# Patient Record
Sex: Male | Born: 1995 | Race: White | Hispanic: No | Marital: Single | Smoking: Never smoker
Health system: Southern US, Community
[De-identification: ages and names within clinical notes are randomized; demographics above are authoritative.]

## PROBLEM LIST (undated history)

## (undated) DIAGNOSIS — Z98811 Dental restoration status: Secondary | ICD-10-CM

## (undated) DIAGNOSIS — S62009K Unspecified fracture of navicular [scaphoid] bone of unspecified wrist, subsequent encounter for fracture with nonunion: Secondary | ICD-10-CM

## (undated) HISTORY — PX: WISDOM TOOTH EXTRACTION: SHX21

## (undated) HISTORY — PX: TYMPANOSTOMY TUBE PLACEMENT: SHX32

---

## 2015-08-19 ENCOUNTER — Encounter (HOSPITAL_BASED_OUTPATIENT_CLINIC_OR_DEPARTMENT_OTHER): Payer: Self-pay | Admitting: *Deleted

## 2015-08-19 ENCOUNTER — Other Ambulatory Visit: Payer: Self-pay | Admitting: Orthopedic Surgery

## 2015-08-20 NOTE — H&P (Signed)
Bryan Romero is an 20 y.o. male.   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  He has 2 seasons remaining.  He reports that he injured his right wrist in a football game, and was evaluated by medical staff, thought to have a wrist sprain, and has continued to play for the remainder of the season taping the wrist.  X-rays were not performed.  Due to continued pain that is becoming worse with lesser activities, he was evaluated on 08-06-15 by Dr. Chinita Romero in [place], who obtained x-rays revealing a nonunion of the right scaphoid at the junction of the middle and proximal one third, with what appeared to be no significant back deformity.  MRI scan was performed on one-27-17, revealing persisting fracture of the proximal scaphoid without significant displacement, with symmetric signal within the proximal and distal poles.  History reviewed. No pertinent past medical history.  Past Surgical History  Procedure Laterality Date  . Wisdom tooth extraction      History reviewed. No pertinent family history. Social History:  reports that he has never smoked. He has never used smokeless tobacco. He reports that he drinks alcohol. He reports that he does not use illicit drugs.  Allergies: No Known Allergies  No prescriptions prior to admission    No results found for this or any previous visit (from the past 48 hour(s)). No results found.  Review of Systems  All other systems reviewed and are negative.   Height  (1.854 m), weight 95.255 kg (210 lb). Physical Exam  Constitutional:  WD, WN, NAD HEENT:  NCAT, EOMI Neuro/Psych:  Alert & oriented to person, place, and time; appropriate mood & affect Lymphatic: No generalized UE edema or lymphadenopathy Extremities / MSK:  Both UE are normal with respect  to appearance, ranges of motion, joint stability, muscle strength/tone, sensation, & perfusion except as otherwise noted:  Right wrist has nearly full motion, tenderness over the proximal pole of the scaphoid and increased pain with grip strength testing which is diminished slightly.  Watson's maneuver negative.  Labs / Xrays:  No radiographic studies obtained today.  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  One could treat this with immobilization and application of ultrasound bone stimulation, or alternatively could proceed with open surgical stabilization and application of ultrasound bone stimulation postoperatively.  Each course has different advantages and disadvantages.  After careful thought and consider deliberation, he indicated he would like to proceed with surgical treatment.  I indicated this would likely be a dorsal approach, with either one large or too smaller screws securing the reduction.  We will plan to seek authorization for and obtained an ultrasound bone stimulator to be applied as an adjunct postoperatively.  He was placed into a forearm-based thumb spica splint with radial stay removed and we will call him once the appropriate arrangements can be completed.  Bryan Romero A., MD 08/20/2015, 1:57 PM

## 2015-08-24 ENCOUNTER — Ambulatory Visit (HOSPITAL_COMMUNITY): Payer: BLUE CROSS/BLUE SHIELD

## 2015-08-24 ENCOUNTER — Encounter (HOSPITAL_BASED_OUTPATIENT_CLINIC_OR_DEPARTMENT_OTHER)
Admission: RE | Disposition: A | Discharge status: Home / Self Care | Payer: Self-pay | Source: Ambulatory Visit | Attending: Orthopedic Surgery

## 2015-08-24 ENCOUNTER — Ambulatory Visit (HOSPITAL_BASED_OUTPATIENT_CLINIC_OR_DEPARTMENT_OTHER)
Admission: RE | Admit: 2015-08-24 | Discharge: 2015-08-24 | Disposition: A | Discharge status: Home / Self Care | Payer: BLUE CROSS/BLUE SHIELD | Source: Ambulatory Visit | Attending: Orthopedic Surgery | Admitting: Orthopedic Surgery

## 2015-08-24 ENCOUNTER — Ambulatory Visit (HOSPITAL_BASED_OUTPATIENT_CLINIC_OR_DEPARTMENT_OTHER): Payer: BLUE CROSS/BLUE SHIELD | Admitting: Certified Registered"

## 2015-08-24 ENCOUNTER — Encounter (HOSPITAL_BASED_OUTPATIENT_CLINIC_OR_DEPARTMENT_OTHER): Payer: Self-pay | Admitting: *Deleted

## 2015-08-24 DIAGNOSIS — S62031A Displaced fracture of proximal third of navicular [scaphoid] bone of right wrist, initial encounter for closed fracture: Secondary | ICD-10-CM | POA: Insufficient documentation

## 2015-08-24 DIAGNOSIS — X58XXXA Exposure to other specified factors, initial encounter: Secondary | ICD-10-CM | POA: Insufficient documentation

## 2015-08-24 DIAGNOSIS — S62301A Unspecified fracture of second metacarpal bone, left hand, initial encounter for closed fracture: Secondary | ICD-10-CM | POA: Diagnosis present

## 2015-08-24 DIAGNOSIS — Z4789 Encounter for other orthopedic aftercare: Secondary | ICD-10-CM

## 2015-08-24 HISTORY — PX: ORIF SCAPHOID FRACTURE: SHX2130

## 2015-08-24 SURGERY — OPEN REDUCTION INTERNAL FIXATION (ORIF) SCAPHOID FRACTURE
Anesthesia: General | Site: Wrist | Laterality: Right

## 2015-08-24 MED ORDER — PROPOFOL 10 MG/ML IV BOLUS
INTRAVENOUS | Status: DC | PRN
  Administered 2015-08-24: 200 mg via INTRAVENOUS

## 2015-08-24 MED ORDER — CEFAZOLIN SODIUM-DEXTROSE 2-3 GM-% IV SOLR
2.0000 g | INTRAVENOUS | Status: AC
  Administered 2015-08-24: 2 g via INTRAVENOUS

## 2015-08-24 MED ORDER — MIDAZOLAM HCL 2 MG/2ML IJ SOLN
INTRAMUSCULAR | Status: AC
  Filled 2015-08-24: qty 2

## 2015-08-24 MED ORDER — LACTATED RINGERS IV SOLN
INTRAVENOUS | Status: DC
  Administered 2015-08-24: 10:00:00 via INTRAVENOUS
  Administered 2015-08-24: 10 mL/h via INTRAVENOUS

## 2015-08-24 MED ORDER — BUPIVACAINE-EPINEPHRINE (PF) 0.5% -1:200000 IJ SOLN
INTRAMUSCULAR | Status: DC | PRN
  Administered 2015-08-24: 30 mL via PERINEURAL

## 2015-08-24 MED ORDER — FENTANYL CITRATE (PF) 100 MCG/2ML IJ SOLN
INTRAMUSCULAR | Status: AC
  Filled 2015-08-24: qty 2

## 2015-08-24 MED ORDER — GLYCOPYRROLATE 0.2 MG/ML IJ SOLN: 0.2000 mg | Freq: Once | INTRAMUSCULAR | Status: DC | PRN

## 2015-08-24 MED ORDER — SCOPOLAMINE 1 MG/3DAYS TD PT72: 1.0000 | MEDICATED_PATCH | Freq: Once | TRANSDERMAL | Status: DC | PRN

## 2015-08-24 MED ORDER — OXYCODONE-ACETAMINOPHEN 5-325 MG PO TABS: 1.0000 | ORAL_TABLET | Freq: Four times a day (QID) | ORAL | Status: DC | PRN

## 2015-08-24 MED ORDER — DEXAMETHASONE SODIUM PHOSPHATE 10 MG/ML IJ SOLN
INTRAMUSCULAR | Status: DC | PRN
  Administered 2015-08-24: 10 mg via INTRAVENOUS

## 2015-08-24 MED ORDER — FENTANYL CITRATE (PF) 100 MCG/2ML IJ SOLN
50.0000 ug | INTRAMUSCULAR | Status: DC | PRN
  Administered 2015-08-24: 100 ug via INTRAVENOUS

## 2015-08-24 MED ORDER — CEFAZOLIN SODIUM-DEXTROSE 2-3 GM-% IV SOLR
INTRAVENOUS | Status: AC
  Filled 2015-08-24: qty 50

## 2015-08-24 MED ORDER — MIDAZOLAM HCL 2 MG/2ML IJ SOLN
1.0000 mg | INTRAMUSCULAR | Status: DC | PRN
  Administered 2015-08-24 (×2): 2 mg via INTRAVENOUS

## 2015-08-24 MED ORDER — LACTATED RINGERS IV SOLN: INTRAVENOUS | Status: DC

## 2015-08-24 MED ORDER — LIDOCAINE HCL (CARDIAC) 20 MG/ML IV SOLN
INTRAVENOUS | Status: AC
  Filled 2015-08-24: qty 5

## 2015-08-24 MED ORDER — ONDANSETRON HCL 4 MG/2ML IJ SOLN
INTRAMUSCULAR | Status: AC
  Filled 2015-08-24: qty 2

## 2015-08-24 MED ORDER — ONDANSETRON HCL 4 MG/2ML IJ SOLN
INTRAMUSCULAR | Status: DC | PRN
  Administered 2015-08-24: 4 mg via INTRAVENOUS

## 2015-08-24 MED ORDER — DEXAMETHASONE SODIUM PHOSPHATE 10 MG/ML IJ SOLN
INTRAMUSCULAR | Status: AC
  Filled 2015-08-24: qty 1

## 2015-08-24 MED ORDER — LIDOCAINE HCL (CARDIAC) 20 MG/ML IV SOLN
INTRAVENOUS | Status: DC | PRN
  Administered 2015-08-24: 30 mg via INTRAVENOUS

## 2015-08-24 SURGICAL SUPPLY — 58 items
BANDAGE COBAN STERILE 2 (GAUZE/BANDAGES/DRESSINGS) IMPLANT
BIT DRILL 1.8 CANN MAX VPC (BIT) ×2 IMPLANT
BIT DRILL CANN 2.4 (BIT) ×2
BIT DRILL CANN MAX VPC 2.4 (BIT) ×1 IMPLANT
BLADE MINI RND TIP GREEN BEAV (BLADE) IMPLANT
BLADE SURG 15 STRL LF DISP TIS (BLADE) ×1 IMPLANT
BLADE SURG 15 STRL SS (BLADE) ×1
BNDG COHESIVE 4X5 TAN STRL (GAUZE/BANDAGES/DRESSINGS) ×2 IMPLANT
BNDG ESMARK 4X9 LF (GAUZE/BANDAGES/DRESSINGS) ×2 IMPLANT
BNDG GAUZE ELAST 4 BULKY (GAUZE/BANDAGES/DRESSINGS) ×4 IMPLANT
BONE MARROW ASPIRATION NEEDLE ×2 IMPLANT
BRUSH SCRUB EZ PLAIN DRY (MISCELLANEOUS) IMPLANT
CANISTER SUCT 1200ML W/VALVE (MISCELLANEOUS) ×2 IMPLANT
CHLORAPREP W/TINT 26ML (MISCELLANEOUS) ×2 IMPLANT
CORDS BIPOLAR (ELECTRODE) ×2 IMPLANT
COVER BACK TABLE 60X90IN (DRAPES) ×2 IMPLANT
COVER MAYO STAND STRL (DRAPES) ×2 IMPLANT
CUFF TOURNIQUET SINGLE 18IN (TOURNIQUET CUFF) ×2 IMPLANT
CUFF TOURNIQUET SINGLE 24IN (TOURNIQUET CUFF) IMPLANT
DRAPE C-ARM 42X72 X-RAY (DRAPES) ×2 IMPLANT
DRAPE EXTREMITY T 121X128X90 (DRAPE) ×2 IMPLANT
DRAPE SURG 17X23 STRL (DRAPES) ×2 IMPLANT
DRSG ADAPTIC 3X8 NADH LF (GAUZE/BANDAGES/DRESSINGS) ×2 IMPLANT
DRSG EMULSION OIL 3X3 NADH (GAUZE/BANDAGES/DRESSINGS) IMPLANT
ELECT REM PT RETURN 9FT ADLT (ELECTROSURGICAL) ×2
ELECTRODE REM PT RTRN 9FT ADLT (ELECTROSURGICAL) ×1 IMPLANT
GAUZE SPONGE 4X4 12PLY STRL (GAUZE/BANDAGES/DRESSINGS) ×2 IMPLANT
GLOVE BIO SURGEON STRL SZ7.5 (GLOVE) ×2 IMPLANT
GLOVE BIOGEL PI IND STRL 7.0 (GLOVE) ×1 IMPLANT
GLOVE BIOGEL PI IND STRL 8 (GLOVE) ×1 IMPLANT
GLOVE BIOGEL PI INDICATOR 7.0 (GLOVE) ×1
GLOVE BIOGEL PI INDICATOR 8 (GLOVE) ×1
GLOVE ECLIPSE 6.5 STRL STRAW (GLOVE) ×4 IMPLANT
GOWN STRL REUS W/TWL XL LVL3 (GOWN DISPOSABLE) ×2 IMPLANT
K-WIRE COCR 0.9X95 (WIRE) ×4
KWIRE COCR 0.9X95 (WIRE) ×2 IMPLANT
NEEDLE HYPO 25X1 1.5 SAFETY (NEEDLE) IMPLANT
NS IRRIG 1000ML POUR BTL (IV SOLUTION) ×2 IMPLANT
PACK BASIN DAY SURGERY FS (CUSTOM PROCEDURE TRAY) ×2 IMPLANT
PADDING CAST ABS 4INX4YD NS (CAST SUPPLIES) ×1
PADDING CAST ABS COTTON 4X4 ST (CAST SUPPLIES) ×1 IMPLANT
PENCIL BUTTON HOLSTER BLD 10FT (ELECTRODE) ×2 IMPLANT
PUTTY DBM STAGRAFT 2CC (Putty) ×2 IMPLANT
RUBBERBAND STERILE (MISCELLANEOUS) IMPLANT
SCREW CANN MAX VPC 3.4X18 (Screw) ×3 IMPLANT
SCREW VPS 3.4X18MM (Screw) ×3 IMPLANT
SLEEVE SCD COMPRESS KNEE MED (MISCELLANEOUS) ×2 IMPLANT
STOCKINETTE 6  STRL (DRAPES) ×1
STOCKINETTE 6 STRL (DRAPES) ×1 IMPLANT
SUT VIC AB 2-0 CT3 27 (SUTURE) ×2 IMPLANT
SUT VICRYL 4-0 PS2 18IN ABS (SUTURE) IMPLANT
SUT VICRYL RAPIDE 4-0 (SUTURE) IMPLANT
SUT VICRYL RAPIDE 4/0 PS 2 (SUTURE) ×2 IMPLANT
SYR BULB 3OZ (MISCELLANEOUS) ×2 IMPLANT
SYRINGE 10CC LL (SYRINGE) IMPLANT
TOWEL OR 17X24 6PK STRL BLUE (TOWEL DISPOSABLE) ×2 IMPLANT
TOWEL OR NON WOVEN STRL DISP B (DISPOSABLE) ×2 IMPLANT
UNDERPAD 30X30 (UNDERPADS AND DIAPERS) ×2 IMPLANT

## 2015-08-24 NOTE — Discharge Instructions (Addendum)
Discharge Instructions ° ° °You have a dressing with a plaster splint incorporated in it. °Move your fingers as much as possible, making a full fist and fully opening the fist. °Elevate your hand to reduce pain & swelling of the digits.  Ice over the operative site may be helpful to reduce pain & swelling.  DO NOT USE HEAT. °Pain medicine has been prescribed for you.  °Use your medicine as needed over the first 48 hours, and then you can begin to taper your use.  You may use Tylenol in place of your prescribed pain medication, but not IN ADDITION to it. °Leave the dressing in place until you return to our office.  °You may shower, but keep the bandage clean & dry.  °You may drive a car when you are off of prescription pain medications and can safely control your vehicle with both hands. ° ° °Please call [phone] during normal business hours or [phone] after hours for any problems. Including the following: ° °- excessive redness of the incisions °- drainage for more than 4 days °- fever of more than 101.5 F ° °*Please note that pain medications will not be refilled after hours or on weekends. ° ° °Post Anesthesia Home Care Instructions ° °Activity: °Get plenty of rest for the remainder of the day. A responsible adult should stay with you for 24 hours following the procedure.  °For the next 24 hours, DO NOT: °-Drive a car °-Operate machinery °-Drink alcoholic beverages °-Take any medication unless instructed by your physician °-Make any legal decisions or sign important papers. ° °Meals: °Start with liquid foods such as gelatin or soup. Progress to regular foods as tolerated. Avoid greasy, spicy, heavy foods. If nausea and/or vomiting occur, drink only clear liquids until the nausea and/or vomiting subsides. Call your physician if vomiting continues. ° °Special Instructions/Symptoms: °Your throat may feel dry or sore from the anesthesia or the breathing tube placed in your throat during surgery. If this  causes discomfort, gargle with warm salt water. The discomfort should disappear within 24 hours. ° °If you had a scopolamine patch placed behind your ear for the management of post- operative nausea and/or vomiting: ° °1. The medication in the patch is effective for 72 hours, after which it should be removed.  Wrap patch in a tissue and discard in the trash. Wash hands thoroughly with soap and water. °2. You may remove the patch earlier than 72 hours if you experience unpleasant side effects which may include dry mouth, dizziness or visual disturbances. °3. Avoid touching the patch. Wash your hands with soap and water after contact with the patch. °Regional Anesthesia Blocks ° °1. Numbness or the inability to move the "blocked" extremity may last from 3-48 hours after placement. The length of time depends on the medication injected and your individual response to the medication. If the numbness is not going away after 48 hours, call your surgeon. ° °2. The extremity that is blocked will need to be protected until the numbness is gone and the  Strength has returned. Because you cannot feel it, you will need to take extra care to avoid injury. Because it may be weak, you may have difficulty moving it or using it. You may not know what position it is in without looking at it while the block is in effect. ° °3. For blocks in the legs and feet, returning to weight bearing and walking needs to be done carefully. You will need to wait until the numbness   is entirely gone and the strength has returned. You should be able to move your leg and foot normally before you try and bear weight or walk. You will need someone to be with you when you first try to ensure you do not fall and possibly risk injury. ° °4. Bruising and tenderness at the needle site are common side effects and will resolve in a few days. ° °5. Persistent numbness or new problems with movement should be communicated to the surgeon or the [facility] ([phone])/ [facility] ([phone]). °  ° ° °

## 2015-08-24 NOTE — Progress Notes (Signed)
Assisted Dr. Crews with right, ultrasound guided, supraclavicular block. Side rails up, monitors on throughout procedure. See vital signs in flow sheet. Tolerated Procedure well. 

## 2015-08-24 NOTE — Op Note (Signed)
08/24/2015  7:43 AM  PATIENT:  Bryan Romero  20 y.o. male  PRE-OPERATIVE DIAGNOSIS:  Right scaphoid proximal pole nonunion  POST-OPERATIVE DIAGNOSIS:  Same  PROCEDURE:  Open treatment of right scaphoid proximal pole nonunion with bone graft substitute augmentation  SURGEON: Cliffton Asters. Janee Morn, MD  PHYSICIAN ASSISTANT: Danielle Rankin, OPA-C  ANESTHESIA:  regional and general  SPECIMENS:  None  DRAINS:   None  EBL:  less than 50 mL  PREOPERATIVE INDICATIONS:  Bryan Romero is a  20 y.o. male with a right scaphoid proximal pole nonunion  The risks benefits and alternatives were discussed with the patient preoperatively including but not limited to the risks of infection, bleeding, nerve injury, cardiopulmonary complications, the need for revision surgery, among others, and the patient verbalized understanding and consented to proceed.  OPERATIVE IMPLANTS: Biomet max the PC screws 2, 1 3.35mm x 18 and one 2.4 x 18  OPERATIVE PROCEDURE:  After receiving prophylactic antibiotics and a regional block, the patient was escorted to the operative theatre and placed in a supine position. Gen. anesthesia was induced. A surgical "time-out" was performed during which the planned procedure, proposed operative site, and the correct patient identity were compared to the operative consent and agreement confirmed by the circulating nurse according to current facility policy.  Following application of a tourniquet to the operative extremity, the exposed skin was prepped with Chloraprep and draped in the usual sterile fashion.  The limb was exsanguinated with an Esmarch bandage and the tourniquet inflated to approximately higher than systolic BP.  A 2 lens exam incision was made over the carpus. The third compartment was opened distally, fourth compartment tendons were retracted ulnarly, and to limited ligament sparing capsulotomy was performed and capsule reflected radially. The nonunion was  identified.  It appeared to have some hinging ulnarly, it was opened, and debrided with a curet. The bone on either side was scarred 5 with the working 70 curet and a a water on power. Biomet Sta-Graft was placed in the side of the nonunion, then it was reduced and secured provisionally with a K wire. Subsequently, a smaller 2.4 mm headless compression screw was placed in standard fashion with fluoroscopic guidance. This entry point was more dorsal and ulnar and the second screw which was placed more prominently and radially, both of them crossing the fracture site and stabilizing the proximal pole nicely, providing compression. The second screw placed was at first a 2.4 mm screw, but the head stripped, requiring its removal before it was completely inserted and I opted to upsize then to a 3 mm screw. In each one, once the path of the screws and then made with the cannulated drill bit, graft material was introduced down the path of the screw with a syringe.  Final fluoroscopic images were obtained, revealing satisfactory alignment of the fracture nonunion site with internal fixation which was not intra-articular.  The wound was irrigated, and the capsule closed with 3-0 Vicryl suture. A small split in the retinaculum was also repaired with the same and the tourniquet was released. Some additional hemostasis was obtained and the skin was closed with 4-0 Vicryl Rapide running horizontal mattress suture. A short arm splint dressing was applied with a thumb spica plaster component and he was awakened and taken to recovery in stable condition, breathing spontaneously.  DISPOSITION: He'll be discharged home today with typical instructions, returning in 10-15 days, at which time she didn't x-rays of the right wrist included a scaphoid view  out of splint and transitioned to application of a short arm thumb spica cast with a window for ultrasound bone stimulation.

## 2015-08-24 NOTE — Anesthesia Procedure Notes (Addendum)
Anesthesia Regional Block:  Infraclavicular brachial plexus block  Pre-Anesthetic Checklist: ,, timeout performed, Correct Patient, Correct Site, Correct Laterality, Correct Procedure, Correct Position, site marked, Risks and benefits discussed,  Surgical consent,  Pre-op evaluation,  At surgeon's request and post-op pain management  Laterality: Right and Upper  Prep: chloraprep       Needles:  Injection technique: Single-shot  Needle Type: Echogenic Stimulator Needle     Needle Length: 5cm 5 cm Needle Gauge: 21 and 21 G    Additional Needles:  Procedures: ultrasound guided (picture in chart) Infraclavicular brachial plexus block Narrative:  Start time: 08/24/2015 8:15 AM End time: 08/24/2015 8:20 AM Injection made incrementally with aspirations every 5 mL.  Performed by: Personally  Anesthesiologist: CREWS, DAVID   Procedure Name: LMA Insertion Date/Time: 08/24/2015 8:54 AM Performed by: Taiya Nutting D Pre-anesthesia Checklist: Patient identified, Emergency Drugs available, Suction available and Patient being monitored Patient Re-evaluated:Patient Re-evaluated prior to inductionOxygen Delivery Method: Circle System Utilized Preoxygenation: Pre-oxygenation with 100% oxygen Intubation Type: IV induction Ventilation: Mask ventilation without difficulty LMA: LMA inserted LMA Size: 4.0 Number of attempts: 1 Airway Equipment and Method: Bite block Placement Confirmation: positive ETCO2 Tube secured with: Tape Dental Injury: Teeth and Oropharynx as per pre-operative assessment       Right Infraclavicular block image

## 2015-08-24 NOTE — Interval H&P Note (Signed)
History and Physical Interval Note:  08/24/2015 7:42 AM  Bryan Romero  has presented today for surgery, with the diagnosis of RIGHT WRIST SCAPHOID NONUNION [phone]  The various methods of treatment have been discussed with the patient and family. After consideration of risks, benefits and other options for treatment, the patient has consented to  Procedure(s) with comments: OPEN TREATMENT OF RIGHT SCAPHOID NONUNION (Right) - PRE -OP BLOCK as a surgical intervention .  The patient's history has been reviewed, patient examined, no change in status, stable for surgery.  I have reviewed the patient's chart and labs.  Questions were answered to the patient's satisfaction.     Bryan Romero A.

## 2015-08-24 NOTE — Anesthesia Preprocedure Evaluation (Addendum)
Anesthesia Evaluation  Patient identified by MRN, date of birth, ID band Patient awake    Reviewed: Allergy & Precautions, NPO status , Patient's Chart, lab work & pertinent test results  Airway Mallampati: I  TM Distance: >3 FB Neck ROM: Full    Dental  (+) Teeth Intact, Dental Advisory Given   Pulmonary  breath sounds clear to auscultation        Cardiovascular Rhythm:Regular Rate:Normal     Neuro/Psych    GI/Hepatic   Endo/Other    Renal/GU      Musculoskeletal   Abdominal   Peds  Hematology   Anesthesia Other Findings   Reproductive/Obstetrics                            Anesthesia Physical Anesthesia Plan  ASA: I  Anesthesia Plan: General   Post-op Pain Management: MAC Combined w/ Regional for Post-op pain   Induction: Intravenous  Airway Management Planned: LMA  Additional Equipment:   Intra-op Plan:   Post-operative Plan: Extubation in OR  Informed Consent: I have reviewed the patients History and Physical, chart, labs and discussed the procedure including the risks, benefits and alternatives for the proposed anesthesia with the patient or authorized representative who has indicated his/her understanding and acceptance.   Dental advisory given  Plan Discussed with: CRNA, Anesthesiologist and Surgeon  Anesthesia Plan Comments:         Anesthesia Quick Evaluation  

## 2015-08-24 NOTE — Transfer of Care (Signed)
Immediate Anesthesia Transfer of Care Note  Patient: Bryan Romero  Procedure(s) Performed: Procedure(s) with comments: OPEN TREATMENT OF RIGHT SCAPHOID NONUNION (Right) - PRE -OP BLOCK  Patient Location: PACU  Anesthesia Type:GA combined with regional for post-op pain  Level of Consciousness: awake and patient cooperative  Airway & Oxygen Therapy: Patient Spontanous Breathing and Patient connected to face mask oxygen  Post-op Assessment: Report given to RN and Post -op Vital signs reviewed and stable  Post vital signs: Reviewed and stable  Last Vitals:  Filed Vitals:   08/24/15 0815 08/24/15 0820  BP: 141/87   Pulse: 75 83  Temp:    Resp: 15 18    Complications: No apparent anesthesia complications

## 2015-08-24 NOTE — Anesthesia Postprocedure Evaluation (Signed)
Anesthesia Post Note  Patient: Trayveon Beckford  Procedure(s) Performed: Procedure(s) (LRB): OPEN TREATMENT OF RIGHT SCAPHOID NONUNION (Right)  Patient location during evaluation: PACU Anesthesia Type: General Level of consciousness: awake and alert Pain management: pain level controlled Vital Signs Assessment: post-procedure vital signs reviewed and stable Respiratory status: spontaneous breathing, nonlabored ventilation and respiratory function stable Cardiovascular status: blood pressure returned to baseline and stable Postop Assessment: no signs of nausea or vomiting Anesthetic complications: no    Last Vitals:  Filed Vitals:   08/24/15 1115 08/24/15 1133  BP: 136/85 149/93  Pulse: 89 85  Temp:  36.7 C  Resp: 15 16    Last Pain:  Filed Vitals:   08/24/15 1135  PainSc: 0-No pain                 Tekesha Almgren A

## 2015-08-25 ENCOUNTER — Encounter (HOSPITAL_BASED_OUTPATIENT_CLINIC_OR_DEPARTMENT_OTHER): Payer: Self-pay | Admitting: Orthopedic Surgery

## 2016-02-16 DIAGNOSIS — S62009K Unspecified fracture of navicular [scaphoid] bone of unspecified wrist, subsequent encounter for fracture with nonunion: Secondary | ICD-10-CM

## 2016-02-16 HISTORY — DX: Unspecified fracture of navicular (scaphoid) bone of unspecified wrist, subsequent encounter for fracture with nonunion: S62.009K

## 2016-03-04 ENCOUNTER — Other Ambulatory Visit: Payer: Self-pay | Admitting: Orthopedic Surgery

## 2016-03-08 ENCOUNTER — Encounter (HOSPITAL_BASED_OUTPATIENT_CLINIC_OR_DEPARTMENT_OTHER): Payer: Self-pay | Admitting: *Deleted

## 2016-03-10 NOTE — H&P (Signed)
Bryan Romero is an 20 y.o. male.   CC / Reason for Visit: Right wrist follow-up HPI: This patient returns for reevaluation, indicating that he continues to use his bone stimulator and wrist brace, but that his pain may be slightly more over this past one month.  He is now back at school and has secured some work which is not physically demanding.  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  Past Medical History:  Diagnosis Date  . Dental crowns present   . Fracture of scaphoid bone with nonunion 02/2016   right - original injury 08/2015    Past Surgical History:  Procedure Laterality Date  . ORIF SCAPHOID FRACTURE Right 08/24/2015   Procedure: OPEN TREATMENT OF RIGHT SCAPHOID NONUNION;  Surgeon: Mack Hookavid Annette Bertelson, MD;  Location: [facility];  Service: Orthopedics;  Laterality: Right;  PRE -OP BLOCK  . TYMPANOSTOMY TUBE PLACEMENT Bilateral   . WISDOM TOOTH EXTRACTION      History reviewed. No pertinent family history. Social History:  reports that he has never smoked. He has never used smokeless tobacco. He reports that he drinks alcohol. He reports that he does not use drugs.  Allergies: No Known Allergies  No prescriptions prior to admission.    No results found for this or any previous visit (from the past 48 hour(s)). No results found.  Review of Systems  All other systems reviewed and are negative.   Height 6\' 1"  (1.854 m), weight 86.2 kg (190 lb). Physical Exam  Constitutional:  WD, WN, NAD HEENT:  NCAT, EOMI Neuro/Psych:  Alert & oriented to person, place, and time; appropriate mood & affect Lymphatic: No generalized UE edema or lymphadenopathy Extremities / MSK:  Both UE are normal with respect to appearance, ranges of motion, joint stability, muscle strength/tone, sensation, & perfusion except as otherwise noted:  Incision is well-healed, digital motion full, wrist motion still moderately restricted.    Labs / X-rays:  4  views of the right wrist ordered and obtained today reveal intact dual compression screw fixation of the proximal scaphoid fracture nonunion.  There certainly appears to be no more evidence for radiographic progress, and some lucency is developing around at least one of the screws  Assessment:  6  months following surgical treatment for right scaphoid proximal nonunion, failing to unite  Plan: I discussed today's findings with him.  At this point, I encouraged him to stop using the bone stimulator.  We discussed details of vascularized bone grafting, and surgery on a Friday would be better for him as it relates to classes.  [facility], but I do not typically have blocks time there on Fridays.  If the resources can be appropriately arranged, we will plan to proceed on Friday, 03-11-16.  I reviewed briefly the surgical incision that he might expect. The details of the operative procedure were discussed with the patient.  Questions were invited and answered.  In addition to the goal of the procedure, the risks of the procedure to include but not limited to bleeding; infection; damage to the nerves or blood vessels that could result in bleeding, numbness, weakness, chronic pain, and the need for additional procedures; stiffness; the need for revision surgery; and anesthetic risks were reviewed.  No specific outcome was guaranteed or implied.  Informed consent was obtained.  Atavia Poppe A., MD 03/10/2016, 10:43 AM

## 2016-03-11 ENCOUNTER — Ambulatory Visit (HOSPITAL_BASED_OUTPATIENT_CLINIC_OR_DEPARTMENT_OTHER): Payer: BLUE CROSS/BLUE SHIELD | Admitting: Certified Registered"

## 2016-03-11 ENCOUNTER — Encounter (HOSPITAL_BASED_OUTPATIENT_CLINIC_OR_DEPARTMENT_OTHER)
Admission: RE | Disposition: A | Discharge status: Home / Self Care | Payer: Self-pay | Source: Ambulatory Visit | Attending: Orthopedic Surgery

## 2016-03-11 ENCOUNTER — Ambulatory Visit (HOSPITAL_BASED_OUTPATIENT_CLINIC_OR_DEPARTMENT_OTHER)
Admission: RE | Admit: 2016-03-11 | Discharge: 2016-03-11 | Disposition: A | Discharge status: Home / Self Care | Payer: BLUE CROSS/BLUE SHIELD | Source: Ambulatory Visit | Attending: Orthopedic Surgery | Admitting: Orthopedic Surgery

## 2016-03-11 ENCOUNTER — Encounter (HOSPITAL_BASED_OUTPATIENT_CLINIC_OR_DEPARTMENT_OTHER): Payer: Self-pay | Admitting: Certified Registered"

## 2016-03-11 ENCOUNTER — Ambulatory Visit (HOSPITAL_COMMUNITY): Payer: BLUE CROSS/BLUE SHIELD

## 2016-03-11 DIAGNOSIS — S62031K Displaced fracture of proximal third of navicular [scaphoid] bone of right wrist, subsequent encounter for fracture with nonunion: Secondary | ICD-10-CM | POA: Diagnosis present

## 2016-03-11 DIAGNOSIS — X58XXXD Exposure to other specified factors, subsequent encounter: Secondary | ICD-10-CM | POA: Diagnosis not present

## 2016-03-11 DIAGNOSIS — Z419 Encounter for procedure for purposes other than remedying health state, unspecified: Secondary | ICD-10-CM

## 2016-03-11 HISTORY — PX: OPEN REDUCTION INTERNAL FIXATION (ORIF) SCAPHOID WITH DISTAL RADIUS GRAFT: SHX5667

## 2016-03-11 HISTORY — DX: Unspecified fracture of navicular (scaphoid) bone of unspecified wrist, subsequent encounter for fracture with nonunion: S62.009K

## 2016-03-11 HISTORY — DX: Dental restoration status: Z98.811

## 2016-03-11 HISTORY — PX: HARDWARE REMOVAL: SHX979

## 2016-03-11 SURGERY — OPEN REDUCTION INTERNAL FIXATION (ORIF) SCAPHOID WITH DISTAL RADIUS GRAFT
Anesthesia: Regional | Site: Wrist | Laterality: Right

## 2016-03-11 MED ORDER — LACTATED RINGERS IV SOLN
INTRAVENOUS | Status: DC
  Administered 2016-03-11 (×2): via INTRAVENOUS

## 2016-03-11 MED ORDER — 0.9 % SODIUM CHLORIDE (POUR BTL) OPTIME
TOPICAL | Status: DC | PRN
  Administered 2016-03-11: 400 mL

## 2016-03-11 MED ORDER — PROPOFOL 10 MG/ML IV BOLUS
INTRAVENOUS | Status: AC
Start: 2016-03-11 — End: 2016-03-11
  Filled 2016-03-11: qty 20

## 2016-03-11 MED ORDER — LIDOCAINE 2% (20 MG/ML) 5 ML SYRINGE
INTRAMUSCULAR | Status: DC | PRN
  Administered 2016-03-11: 100 mg via INTRAVENOUS

## 2016-03-11 MED ORDER — OXYCODONE HCL 5 MG PO TABS
5.0000 mg | ORAL_TABLET | Freq: Once | ORAL | Status: AC
  Administered 2016-03-11: 5 mg via ORAL

## 2016-03-11 MED ORDER — LIDOCAINE 2% (20 MG/ML) 5 ML SYRINGE
INTRAMUSCULAR | Status: AC
  Filled 2016-03-11: qty 5

## 2016-03-11 MED ORDER — ONDANSETRON HCL 4 MG/2ML IJ SOLN
INTRAMUSCULAR | Status: DC | PRN
  Administered 2016-03-11: 4 mg via INTRAVENOUS

## 2016-03-11 MED ORDER — CEFAZOLIN SODIUM-DEXTROSE 2-4 GM/100ML-% IV SOLN
INTRAVENOUS | Status: AC
  Filled 2016-03-11: qty 100

## 2016-03-11 MED ORDER — FENTANYL CITRATE (PF) 100 MCG/2ML IJ SOLN
50.0000 ug | INTRAMUSCULAR | Status: DC | PRN
  Administered 2016-03-11: 100 ug via INTRAVENOUS

## 2016-03-11 MED ORDER — ALBUTEROL SULFATE HFA 108 (90 BASE) MCG/ACT IN AERS
INHALATION_SPRAY | RESPIRATORY_TRACT | Status: AC
  Filled 2016-03-11: qty 6.7

## 2016-03-11 MED ORDER — OXYCODONE HCL 5 MG PO TABS
ORAL_TABLET | ORAL | Status: AC
  Filled 2016-03-11: qty 1

## 2016-03-11 MED ORDER — DEXAMETHASONE SODIUM PHOSPHATE 10 MG/ML IJ SOLN
INTRAMUSCULAR | Status: AC
Start: 2016-03-11 — End: 2016-03-11
  Filled 2016-03-11: qty 1

## 2016-03-11 MED ORDER — MIDAZOLAM HCL 2 MG/2ML IJ SOLN
INTRAMUSCULAR | Status: AC
  Filled 2016-03-11: qty 2

## 2016-03-11 MED ORDER — LIDOCAINE HCL (CARDIAC) 20 MG/ML IV SOLN: INTRAVENOUS | Status: DC | PRN

## 2016-03-11 MED ORDER — GLYCOPYRROLATE 0.2 MG/ML IJ SOLN: 0.2000 mg | Freq: Once | INTRAMUSCULAR | Status: DC | PRN

## 2016-03-11 MED ORDER — HYDROMORPHONE HCL 1 MG/ML IJ SOLN
0.2500 mg | INTRAMUSCULAR | Status: DC | PRN
  Administered 2016-03-11 (×2): 0.5 mg via INTRAVENOUS

## 2016-03-11 MED ORDER — HYDROMORPHONE HCL 1 MG/ML IJ SOLN
INTRAMUSCULAR | Status: AC
  Filled 2016-03-11: qty 1

## 2016-03-11 MED ORDER — CEFAZOLIN SODIUM-DEXTROSE 2-4 GM/100ML-% IV SOLN
2.0000 g | INTRAVENOUS | Status: AC
  Administered 2016-03-11: 2 g via INTRAVENOUS

## 2016-03-11 MED ORDER — SCOPOLAMINE 1 MG/3DAYS TD PT72
1.0000 | MEDICATED_PATCH | Freq: Once | TRANSDERMAL | Status: DC | PRN
Start: 2016-03-11 — End: 2016-03-11

## 2016-03-11 MED ORDER — BUPIVACAINE-EPINEPHRINE (PF) 0.5% -1:200000 IJ SOLN
INTRAMUSCULAR | Status: DC | PRN
  Administered 2016-03-11: 30 mL via PERINEURAL

## 2016-03-11 MED ORDER — DEXAMETHASONE SODIUM PHOSPHATE 10 MG/ML IJ SOLN
INTRAMUSCULAR | Status: DC | PRN
  Administered 2016-03-11: 10 mg via INTRAVENOUS

## 2016-03-11 MED ORDER — PROPOFOL 10 MG/ML IV BOLUS
INTRAVENOUS | Status: DC | PRN
Start: 2016-03-11 — End: 2016-03-11
  Administered 2016-03-11: 250 mg via INTRAVENOUS

## 2016-03-11 MED ORDER — MIDAZOLAM HCL 2 MG/2ML IJ SOLN
1.0000 mg | INTRAMUSCULAR | Status: DC | PRN
  Administered 2016-03-11: 2 mg via INTRAVENOUS

## 2016-03-11 MED ORDER — FENTANYL CITRATE (PF) 100 MCG/2ML IJ SOLN
INTRAMUSCULAR | Status: AC
  Filled 2016-03-11: qty 2

## 2016-03-11 MED ORDER — LACTATED RINGERS IV SOLN
INTRAVENOUS | Status: DC
  Administered 2016-03-11: 11:00:00 via INTRAVENOUS

## 2016-03-11 MED ORDER — OXYCODONE-ACETAMINOPHEN 5-325 MG PO TABS: 1.0000 | ORAL_TABLET | Freq: Four times a day (QID) | ORAL | 0 refills | Status: AC | PRN

## 2016-03-11 MED ORDER — ONDANSETRON HCL 4 MG/2ML IJ SOLN
INTRAMUSCULAR | Status: AC
  Filled 2016-03-11: qty 2

## 2016-03-11 SURGICAL SUPPLY — 70 items
BANDAGE COBAN STERILE 2 (GAUZE/BANDAGES/DRESSINGS) IMPLANT
BLADE MINI RND TIP GREEN BEAV (BLADE) IMPLANT
BLADE SURG 15 STRL LF DISP TIS (BLADE) ×2 IMPLANT
BLADE SURG 15 STRL SS (BLADE) ×2
BNDG COHESIVE 4X5 TAN STRL (GAUZE/BANDAGES/DRESSINGS) ×4 IMPLANT
BNDG ESMARK 4X9 LF (GAUZE/BANDAGES/DRESSINGS) ×4 IMPLANT
BNDG GAUZE ELAST 4 BULKY (GAUZE/BANDAGES/DRESSINGS) ×4 IMPLANT
BRUSH SCRUB EZ PLAIN DRY (MISCELLANEOUS) IMPLANT
CANISTER SUCT 1200ML W/VALVE (MISCELLANEOUS) ×4 IMPLANT
CHLORAPREP W/TINT 26ML (MISCELLANEOUS) ×4 IMPLANT
CORDS BIPOLAR (ELECTRODE) ×4 IMPLANT
COVER BACK TABLE 60X90IN (DRAPES) ×4 IMPLANT
COVER MAYO STAND STRL (DRAPES) ×4 IMPLANT
CUFF TOURNIQUET SINGLE 18IN (TOURNIQUET CUFF) ×4 IMPLANT
CUFF TOURNIQUET SINGLE 24IN (TOURNIQUET CUFF) IMPLANT
DRAPE C-ARM 42X72 X-RAY (DRAPES) ×4 IMPLANT
DRAPE EXTREMITY T 121X128X90 (DRAPE) ×4 IMPLANT
DRAPE INCISE IOBAN 66X45 STRL (DRAPES) IMPLANT
DRAPE SURG 17X23 STRL (DRAPES) ×4 IMPLANT
DRSG ADAPTIC 3X8 NADH LF (GAUZE/BANDAGES/DRESSINGS) ×4 IMPLANT
DRSG EMULSION OIL 3X3 NADH (GAUZE/BANDAGES/DRESSINGS) IMPLANT
ELECT REM PT RETURN 9FT ADLT (ELECTROSURGICAL) ×4
ELECTRODE REM PT RTRN 9FT ADLT (ELECTROSURGICAL) ×2 IMPLANT
GAUZE SPONGE 4X4 12PLY STRL (GAUZE/BANDAGES/DRESSINGS) ×4 IMPLANT
GLOVE BIO SURGEON STRL SZ7.5 (GLOVE) ×4 IMPLANT
GLOVE BIOGEL M STRL SZ7.5 (GLOVE) ×4 IMPLANT
GLOVE BIOGEL PI IND STRL 7.0 (GLOVE) ×2 IMPLANT
GLOVE BIOGEL PI IND STRL 7.5 (GLOVE) ×2 IMPLANT
GLOVE BIOGEL PI IND STRL 8 (GLOVE) ×4 IMPLANT
GLOVE BIOGEL PI INDICATOR 7.0 (GLOVE) ×2
GLOVE BIOGEL PI INDICATOR 7.5 (GLOVE) ×2
GLOVE BIOGEL PI INDICATOR 8 (GLOVE) ×4
GLOVE ECLIPSE 6.5 STRL STRAW (GLOVE) ×4 IMPLANT
GLOVE EXAM NITRILE MD LF STRL (GLOVE) ×4 IMPLANT
GLOVE SURG SS PI 7.5 STRL IVOR (GLOVE) ×4 IMPLANT
GOWN STRL REUS W/TWL XL LVL3 (GOWN DISPOSABLE) ×12 IMPLANT
GUIDEWIRE ORTHO MICROSHT  ACUT (WIRE) ×2
GUIDEWIRE ORTHO MICROSHT .035 (WIRE) ×2 IMPLANT
GUIDEWIRE ORTHO MINI ACTK .045 (WIRE) ×4 IMPLANT
NEEDLE 16GX1 1/2 (NEEDLE) IMPLANT
NEEDLE HYPO 25X1 1.5 SAFETY (NEEDLE) IMPLANT
NS IRRIG 1000ML POUR BTL (IV SOLUTION) ×4 IMPLANT
PACK BASIN DAY SURGERY FS (CUSTOM PROCEDURE TRAY) ×4 IMPLANT
PADDING CAST ABS 4INX4YD NS (CAST SUPPLIES) ×2
PADDING CAST ABS COTTON 4X4 ST (CAST SUPPLIES) ×2 IMPLANT
PENCIL BUTTON HOLSTER BLD 10FT (ELECTRODE) ×4 IMPLANT
RUBBERBAND STERILE (MISCELLANEOUS) IMPLANT
SLEEVE SCD COMPRESS KNEE MED (MISCELLANEOUS) ×4 IMPLANT
SPLINT FAST PLASTER 5X30 (CAST SUPPLIES) ×2
SPLINT PLASTER CAST FAST 5X30 (CAST SUPPLIES) ×2 IMPLANT
SPONGE LAP 4X18 X RAY DECT (DISPOSABLE) ×4 IMPLANT
STOCKINETTE 6  STRL (DRAPES) ×2
STOCKINETTE 6 STRL (DRAPES) ×2 IMPLANT
SUCTION FRAZIER HANDLE 10FR (MISCELLANEOUS) ×2
SUCTION TUBE FRAZIER 10FR DISP (MISCELLANEOUS) ×2 IMPLANT
SUT VIC AB 0 CT1 27 (SUTURE)
SUT VIC AB 0 CT1 27XBRD ANBCTR (SUTURE) IMPLANT
SUT VIC AB 2-0 CT3 27 (SUTURE) ×4 IMPLANT
SUT VIC AB 2-0 SH 27 (SUTURE)
SUT VIC AB 2-0 SH 27XBRD (SUTURE) IMPLANT
SUT VICRYL 4-0 PS2 18IN ABS (SUTURE) IMPLANT
SUT VICRYL RAPIDE 4-0 (SUTURE) ×4 IMPLANT
SUT VICRYL RAPIDE 4/0 PS 2 (SUTURE) ×4 IMPLANT
SYR BULB 3OZ (MISCELLANEOUS) ×4 IMPLANT
SYRINGE 10CC LL (SYRINGE) IMPLANT
TOWEL OR 17X24 6PK STRL BLUE (TOWEL DISPOSABLE) ×4 IMPLANT
TOWEL OR NON WOVEN STRL DISP B (DISPOSABLE) ×4 IMPLANT
TUBE CONNECTING 20'X1/4 (TUBING) ×1
TUBE CONNECTING 20X1/4 (TUBING) ×3 IMPLANT
UNDERPAD 30X30 (UNDERPADS AND DIAPERS) ×4 IMPLANT

## 2016-03-11 NOTE — Anesthesia Procedure Notes (Addendum)
Anesthesia Regional Block:  Supraclavicular block  Pre-Anesthetic Checklist: ,, timeout performed, Correct Patient, Correct Site, Correct Laterality, Correct Procedure, Correct Position, site marked, Risks and benefits discussed, pre-op evaluation,  At surgeon's request and post-op pain management  Laterality: Right  Prep: Maximum Sterile Barrier Precautions used, chloraprep       Needles:  Injection technique: Single-shot  Needle Type: Echogenic Stimulator Needle     Needle Length: 5cm 5 cm Needle Gauge: 22 and 22 G    Additional Needles:  Procedures: ultrasound guided (picture in chart) Supraclavicular block Narrative:  Start time: 03/11/2016 11:30 AM End time: 03/11/2016 11:40 AM Injection made incrementally with aspirations every 5 mL. Anesthesiologist: Gaynelle AduFITZGERALD, Tharon Kitch  Additional Notes: 2% Lidocaine skin wheel.

## 2016-03-11 NOTE — Discharge Instructions (Addendum)
Discharge Instructions   You have a dressing with a plaster splint incorporated in it. Move your fingers as much as possible, making a full fist and fully opening the fist. NO LIFTING WITH YOUR RIGHT HAND MORE THAN THE WEIGHT OF A PENCIL. Elevate your hand to reduce pain & swelling of the digits.  Ice over the operative site may be helpful to reduce pain & swelling.  DO NOT USE HEAT. Pain medicine has been prescribed for you.  Use your medicine as needed over the first 48 hours, and then you can begin to taper your use.  You may use Tylenol in place of your prescribed pain medication, but not IN ADDITION to it. Leave the dressing in place until you return to our office.  You may shower, but keep the bandage clean & dry.  You may drive a car when you are off of prescription pain medications and can safely control your vehicle with both hands. Our office will call you to arrange follow-up   Please call [phone] during normal business hours or [phone] after hours for any problems. Including the following:  - excessive redness of the incisions - drainage for more than 4 days - fever of more than 101.5 F  *Please note that pain medications will not be refilled after hours or on weekends. Regional Anesthesia Blocks  1. Numbness or the inability to move the "blocked" extremity may last from 3-48 hours after placement. The length of time depends on the medication injected and your individual response to the medication. If the numbness is not going away after 48 hours, call your surgeon.  2. The extremity that is blocked will need to be protected until the numbness is gone and the  Strength has returned. Because you cannot feel it, you will need to take extra care to avoid injury. Because it may be weak, you may have difficulty moving it or using it. You may not know what position it is in without looking at it while the block is in effect.  3. For blocks in the legs and feet, returning  to weight bearing and walking needs to be done carefully. You will need to wait until the numbness is entirely gone and the strength has returned. You should be able to move your leg and foot normally before you try and bear weight or walk. You will need someone to be with you when you first try to ensure you do not fall and possibly risk injury.  4. Bruising and tenderness at the needle site are common side effects and will resolve in a few days.  5. Persistent numbness or new problems with movement should be communicated to the surgeon or the [facility] [phone])/ [facility] [place] [phone]).   Post Anesthesia Home Care Instructions  Activity: Get plenty of rest for the remainder of the day. A responsible adult should stay with you for 24 hours following the procedure.  For the next 24 hours, DO NOT: -Drive a car -Advertising copywriterperate machinery -Drink alcoholic beverages -Take any medication unless instructed by your physician -Make any legal decisions or sign important papers.  Meals: Start with liquid foods such as gelatin or soup. Progress to regular foods as tolerated. Avoid greasy, spicy, heavy foods. If nausea and/or vomiting occur, drink only clear liquids until the nausea and/or vomiting subsides. Call your physician if vomiting continues.  Special Instructions/Symptoms: Your throat may feel dry or sore from the anesthesia or the breathing tube placed in your  throat during surgery. If this causes discomfort, gargle with warm salt water. The discomfort should disappear within 24 hours.  If you had a scopolamine patch placed behind your ear for the management of post- operative nausea and/or vomiting:  1. The medication in the patch is effective for 72 hours, after which it should be removed.  Wrap patch in a tissue and discard in the trash. Wash hands thoroughly with soap and water. 2. You may remove the patch earlier than 72 hours if you experience unpleasant  side effects which may include dry mouth, dizziness or visual disturbances. 3. Avoid touching the patch. Wash your hands with soap and water after contact with the patch.

## 2016-03-11 NOTE — Op Note (Signed)
03/11/2016  11:20 AM  PATIENT:  Bryan Romero  20 y.o. male  PRE-OPERATIVE DIAGNOSIS:  Right scaphoid nonunion  POST-OPERATIVE DIAGNOSIS:  Same  PROCEDURE:  Open treatment of right scaphoid nonunion with removal of hardware, vascularized bone grafting and internal fixation  SURGEON: Cliffton Asters. Janee Morn, MD  PHYSICIAN ASSISTANT: Danielle Rankin, OPA-C  ANESTHESIA:  regional and general  SPECIMENS:  None  DRAINS:   None  EBL:  less than 50 mL  PREOPERATIVE INDICATIONS:  Bryan Romero is a  20 y.o. 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  OPERATIVE IMPLANTS: Micro-Acutrak screw, 18 length  OPERATIVE PROCEDURE:  After receiving prophylactic antibiotics and a regional block, the patient was escorted to the operative theatre and placed in a supine position.  General anesthesia was administered A surgical "time-out" was performed during which the planned procedure, proposed operative site, and the correct patient identity were compared to the operative consent and agreement confirmed by the circulating nurse according to current facility policy.  Following application of a tourniquet to the operative extremity, the exposed skin was prepped with Chloraprep and draped in the usual sterile fashion.  The limb was not exsanguinated and the tourniquet inflated to approximately higher than systolic BP.  The previous zigzag incision was marked and made, extending it proximally with some additional zigzags.  The skin was reflected full-thickness.  The third compartment was opened, transposing the EPL  tendon.  There appeared to be a nice 1, 2 SRICA .  The fourth and fifth compartments were also opened reflecting the retinaculum radially and there appeared to be adequate sources of graft there as well.  A mostly transverse dorsal capsulotomy was made exposing the proximal pole.  The cleft could be identified.  The joint was flexed, and this allowed the 2 previous screws to be removed by placing guidewires within the screw and a cannulated screwdriver to remove them.  The proximal pole had movement.  It was largely follow with just a very thin shell of bone covered by the cartilage proximally.  The bone on the adjacent surface of the rest of the scaphoid was hard and somewhat sclerotic.  This was debrided back with a curette.  The graft was then harvested in standard fashion using osteotomes to derive a cortical cancellus graft from the radial styloid with a capsular periosteal artery containing pedicle.  The cancellus portion of the graft was placed into the nonunion site, together with some additional cancellus graft from styloid.  The tourniquet was released, there was perfusion evidenced visually at the graft.  The graft was then placed in a single micro-Acutrak screw of 18 length was placed to try to gain stability mechanically of the fragment.  The EPL was returned to its native position, the retinacular splits were all closed with 2-0 Vicryl suture after the wound is copiously irrigated.  The skin was reapproximated with 2-0 Vicryl deep dermal buried interrupted sutures and then 2 different 0.062 inch K wires were driven from radial to distal ulnar, one across the radius and into the lunate to help secure it to the end of the radius with the wrist slightly extended.  The second was  to the distal half of the scaphoid into the capitate.  These were bent over at the skin and clipped 90.  The skin was then reapproximated with 4-0 Vicryl Rapide running horizontal mattress suture and a short arm splint dressing  was applied.  He was awakened and taken to the recovery room stable condition, breathing spontaneously.  DISPOSITION: He'll be discharged today, with typical instructions, returning in 10-15 days, at which time he should have new x-rays of the right wrist in the splint to include an inclined lateral, and then conversion to a short arm cast with a window for placing the bone stimulator.

## 2016-03-11 NOTE — Transfer of Care (Signed)
Immediate Anesthesia Transfer of Care Note  Patient: Bryan Romero  Procedure(s) Performed: Procedure(s) with comments: RIGHT WRIST REMOVAL OF HARDWARE, REVISION OPEN TREATMENT OF SCAPHOID NONUNION WITH VASCULARIZED BONE GRAFT (Right) - GENERAL ANESTHESIA WITH PRE-OP BLOCK HARDWARE REMOVAL  Patient Location: PACU  Anesthesia Type:GA combined with regional for post-op pain  Level of Consciousness: awake, alert  and patient cooperative  Airway & Oxygen Therapy: Patient Spontanous Breathing and Patient connected to face mask oxygen  Post-op Assessment: Report given to RN, Post -op Vital signs reviewed and stable and Patient moving all extremities  Post vital signs: Reviewed and stable  Last Vitals:  Vitals:   03/11/16 1142 03/11/16 1143  BP:    Pulse: 71 79  Resp: 16 18  Temp:      Last Pain:  Vitals:   03/11/16 1033  TempSrc: Oral  PainSc:          Complications: No apparent anesthesia complications

## 2016-03-11 NOTE — OR Nursing (Signed)
One screw removed 18mm, was re-implanted

## 2016-03-11 NOTE — Anesthesia Postprocedure Evaluation (Addendum)
Anesthesia Post Note  Patient: Bryan Romero  Procedure(s) Performed: Procedure(s) (LRB): RIGHT WRIST REMOVAL OF HARDWARE, REVISION OPEN TREATMENT OF SCAPHOID NONUNION WITH VASCULARIZED BONE GRAFT (Right) HARDWARE REMOVAL  Patient location during evaluation: PACU Anesthesia Type: General and Regional Level of consciousness: awake and alert Pain management: pain level controlled Vital Signs Assessment: post-procedure vital signs reviewed and stable Respiratory status: spontaneous breathing, nonlabored ventilation, respiratory function stable and patient connected to nasal cannula oxygen Cardiovascular status: blood pressure returned to baseline and stable Postop Assessment: no signs of nausea or vomiting Anesthetic complications: no    Last Vitals:  Vitals:   03/11/16 1608 03/11/16 1626  BP:  130/71  Pulse: 85 74  Resp: 12 16  Temp:  36.6 C    Last Pain:  Vitals:   03/11/16 1626  TempSrc:   PainSc: 4                  Bryan Romero

## 2016-03-11 NOTE — Anesthesia Procedure Notes (Signed)
Procedure Name: LMA Insertion Date/Time: 03/11/2016 1:24 PM Performed by: Curly ShoresRAFT, Emme Rosenau W Pre-anesthesia Checklist: Patient identified, Emergency Drugs available, Suction available and Patient being monitored Patient Re-evaluated:Patient Re-evaluated prior to inductionOxygen Delivery Method: Circle system utilized Preoxygenation: Pre-oxygenation with 100% oxygen Intubation Type: IV induction Ventilation: Mask ventilation without difficulty LMA: LMA inserted LMA Size: 5.0 Number of attempts: 1 Airway Equipment and Method: Bite block Placement Confirmation: positive ETCO2 and breath sounds checked- equal and bilateral Tube secured with: Tape Dental Injury: Teeth and Oropharynx as per pre-operative assessment

## 2016-03-11 NOTE — Progress Notes (Signed)
Assisted Dr. Edmond Fitzgerald with right, ultrasound guided, supraclavicular block. Side rails up, monitors on throughout procedure. See vital signs in flow sheet. Tolerated Procedure well. 

## 2016-03-11 NOTE — Anesthesia Preprocedure Evaluation (Addendum)
Anesthesia Evaluation  Patient identified by MRN, date of birth, ID band Patient awake    Reviewed: Allergy & Precautions, H&P , NPO status , Patient's Chart, lab work & pertinent test results  Airway Mallampati: I  TM Distance: >3 FB Neck ROM: Full    Dental no notable dental hx. (+) Teeth Intact, Dental Advisory Given   Pulmonary neg pulmonary ROS,    Pulmonary exam normal breath sounds clear to auscultation       Cardiovascular negative cardio ROS   Rhythm:Regular Rate:Normal     Neuro/Psych negative neurological ROS  negative psych ROS   GI/Hepatic negative GI ROS, Neg liver ROS,   Endo/Other  negative endocrine ROS  Renal/GU negative Renal ROS  negative genitourinary   Musculoskeletal   Abdominal   Peds  Hematology negative hematology ROS (+)   Anesthesia Other Findings   Reproductive/Obstetrics negative OB ROS                            Anesthesia Physical Anesthesia Plan  ASA: I  Anesthesia Plan: General and Regional   Post-op Pain Management: GA combined w/ Regional for post-op pain   Induction: Intravenous  Airway Management Planned: LMA  Additional Equipment:   Intra-op Plan:   Post-operative Plan: Extubation in OR  Informed Consent: I have reviewed the patients History and Physical, chart, labs and discussed the procedure including the risks, benefits and alternatives for the proposed anesthesia with the patient or authorized representative who has indicated his/her understanding and acceptance.   Dental advisory given  Plan Discussed with: CRNA  Anesthesia Plan Comments:         Anesthesia Quick Evaluation  

## 2016-03-11 NOTE — Interval H&P Note (Signed)
History and Physical Interval Note:  03/11/2016 11:20 AM  Bryan KoyanagiAndrew C Blok  has presented today for surgery, with the diagnosis of RIGHT SCAPHOID NON-UNION [id number]  The various methods of treatment have been discussed with the patient and family. After consideration of risks, benefits and other options for treatment, the patient has consented to  Procedure(s) with comments: RIGHT WRIST REMOVAL OF HARDWARE, REVISION OPEN TREATMENT OF SCAPHOID NONUNION WITH VASCULORIZED BONE GRAFT (Right) - GENERAL ANESTHESIA WITH PRE-OP BLOCK as a surgical intervention .  The patient's history has been reviewed, patient examined, no change in status, stable for surgery.  I have reviewed the patient's chart and labs.  Questions were answered to the patient's satisfaction.     [name]

## 2016-03-14 ENCOUNTER — Encounter (HOSPITAL_BASED_OUTPATIENT_CLINIC_OR_DEPARTMENT_OTHER): Payer: Self-pay | Admitting: Orthopedic Surgery

## 2018-08-23 IMAGING — RF DG WRIST COMPLETE 3+V*R*
1 series · 4 of 4 positions shown · non-contrast
Comparison: 08/24/2015

CLINICAL DATA: Removal of right wrist hardware

EXAM:
DG C-ARM 61-120 MIN; RIGHT WRIST - COMPLETE 3+ VIEW

[Series 1: run · 4 of 4 slices shown]
[im 1/4]
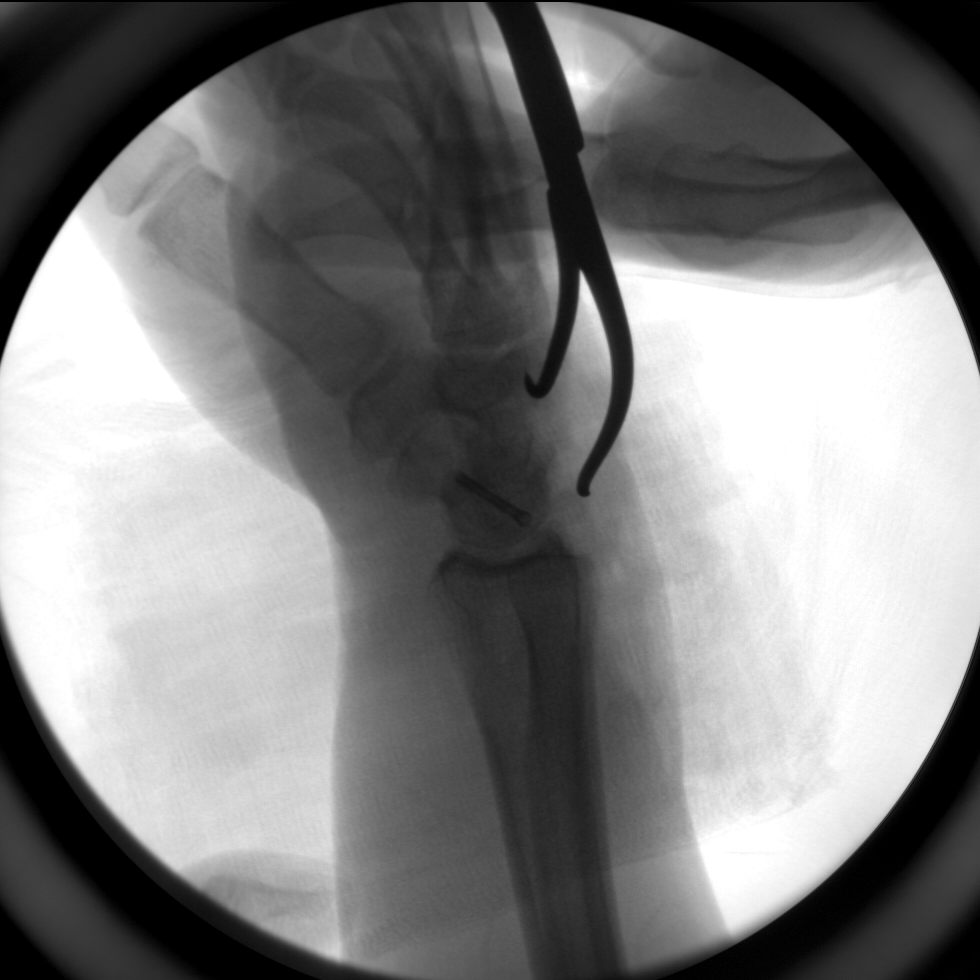
[im 2/4]
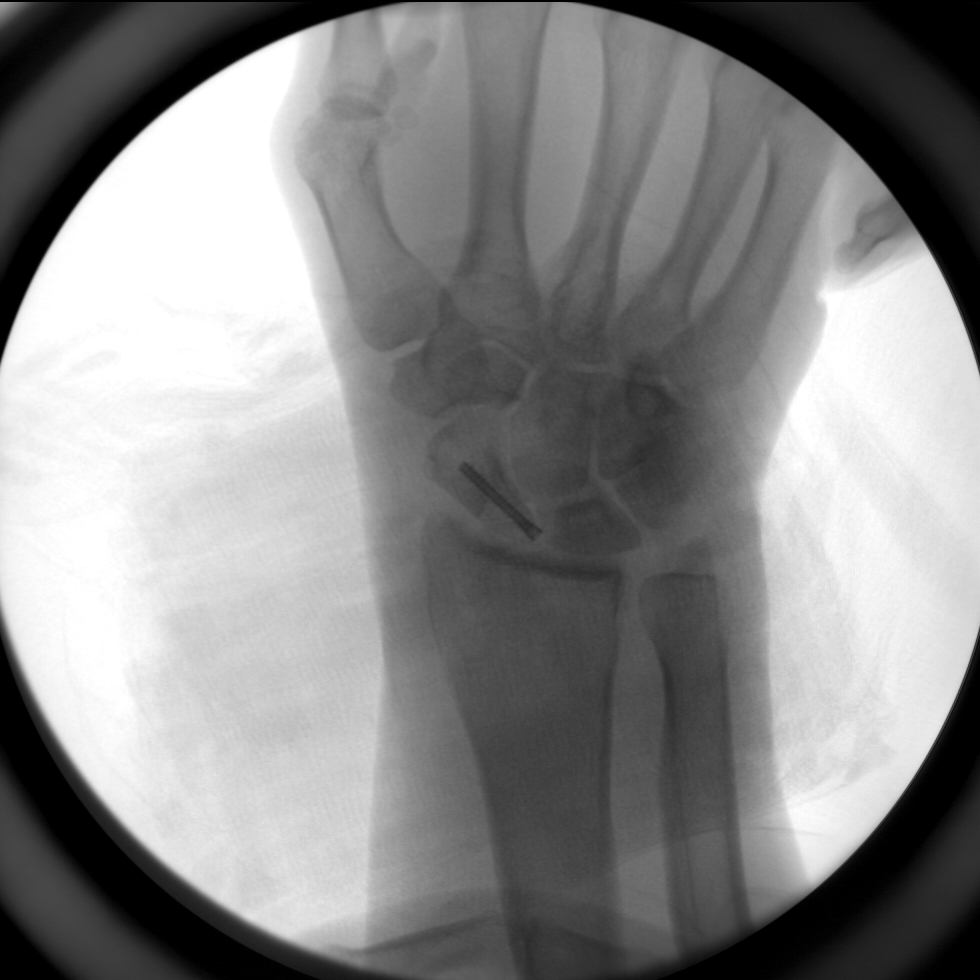
[im 3/4]
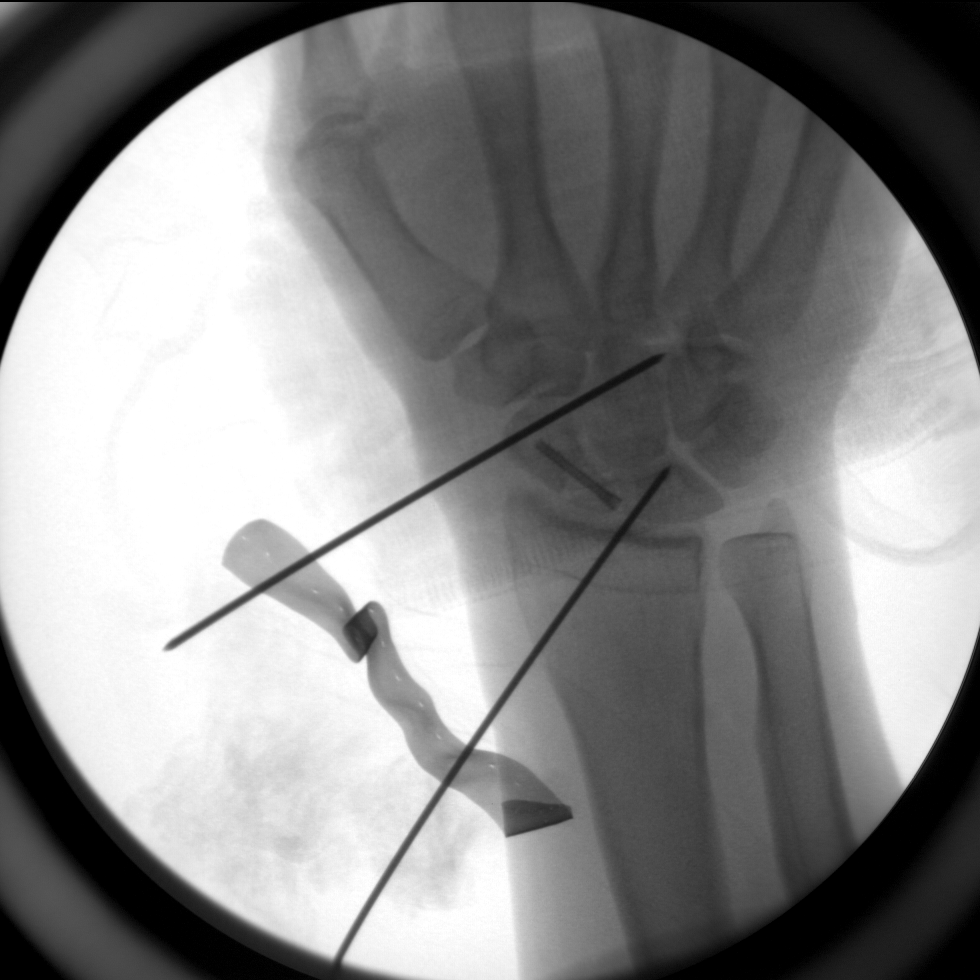
[im 4/4]
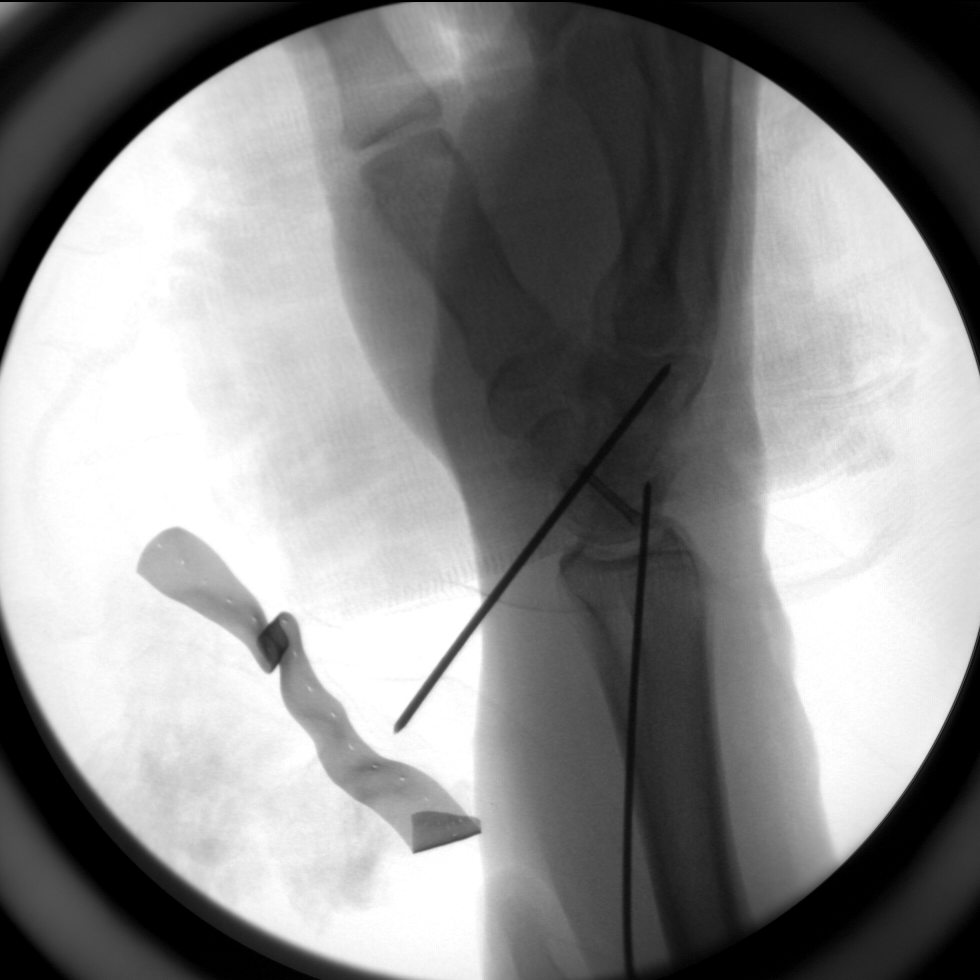

[4 of 4 positions shown; findings below may reference images not displayed]

FINDINGS: Four intraoperative spot fluoro films are submitted. First image at
1111 hours shows soft tissue retractors with a single remaining
cannulated screw in the scaphoid. Second image at 4007 hours
confirms a fixation screw in the navicular. Third image at 6856
hours shows to fixation pins with tips overlying the carpus. Final
image at 6856 hours is a lateral view with the fixation pins again
noted overlying the carpus.
IMPRESSION: Intraoperative localization.
